# Patient Record
Sex: Female | Born: 2000 | Race: Black or African American | Hispanic: No | Marital: Single | Smoking: Never smoker
Health system: Southern US, Community
[De-identification: ages and names within clinical notes are randomized; demographics above are authoritative.]

## PROBLEM LIST (undated history)

## (undated) DIAGNOSIS — J4520 Mild intermittent asthma, uncomplicated: Secondary | ICD-10-CM

## (undated) DIAGNOSIS — L309 Dermatitis, unspecified: Secondary | ICD-10-CM

## (undated) DIAGNOSIS — J45909 Unspecified asthma, uncomplicated: Secondary | ICD-10-CM

## (undated) HISTORY — DX: Mild intermittent asthma, uncomplicated: J45.20

## (undated) HISTORY — DX: Dermatitis, unspecified: L30.9

---
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
## 2020-04-30 IMAGING — DX PORTABLE CHEST - 1 VIEW
1 series · 1 of 1 positions shown · non-contrast
Comparison: None.

CLINICAL DATA: Upper abdominal pain

EXAM:
PORTABLE CHEST 1 VIEW

[chest ap]
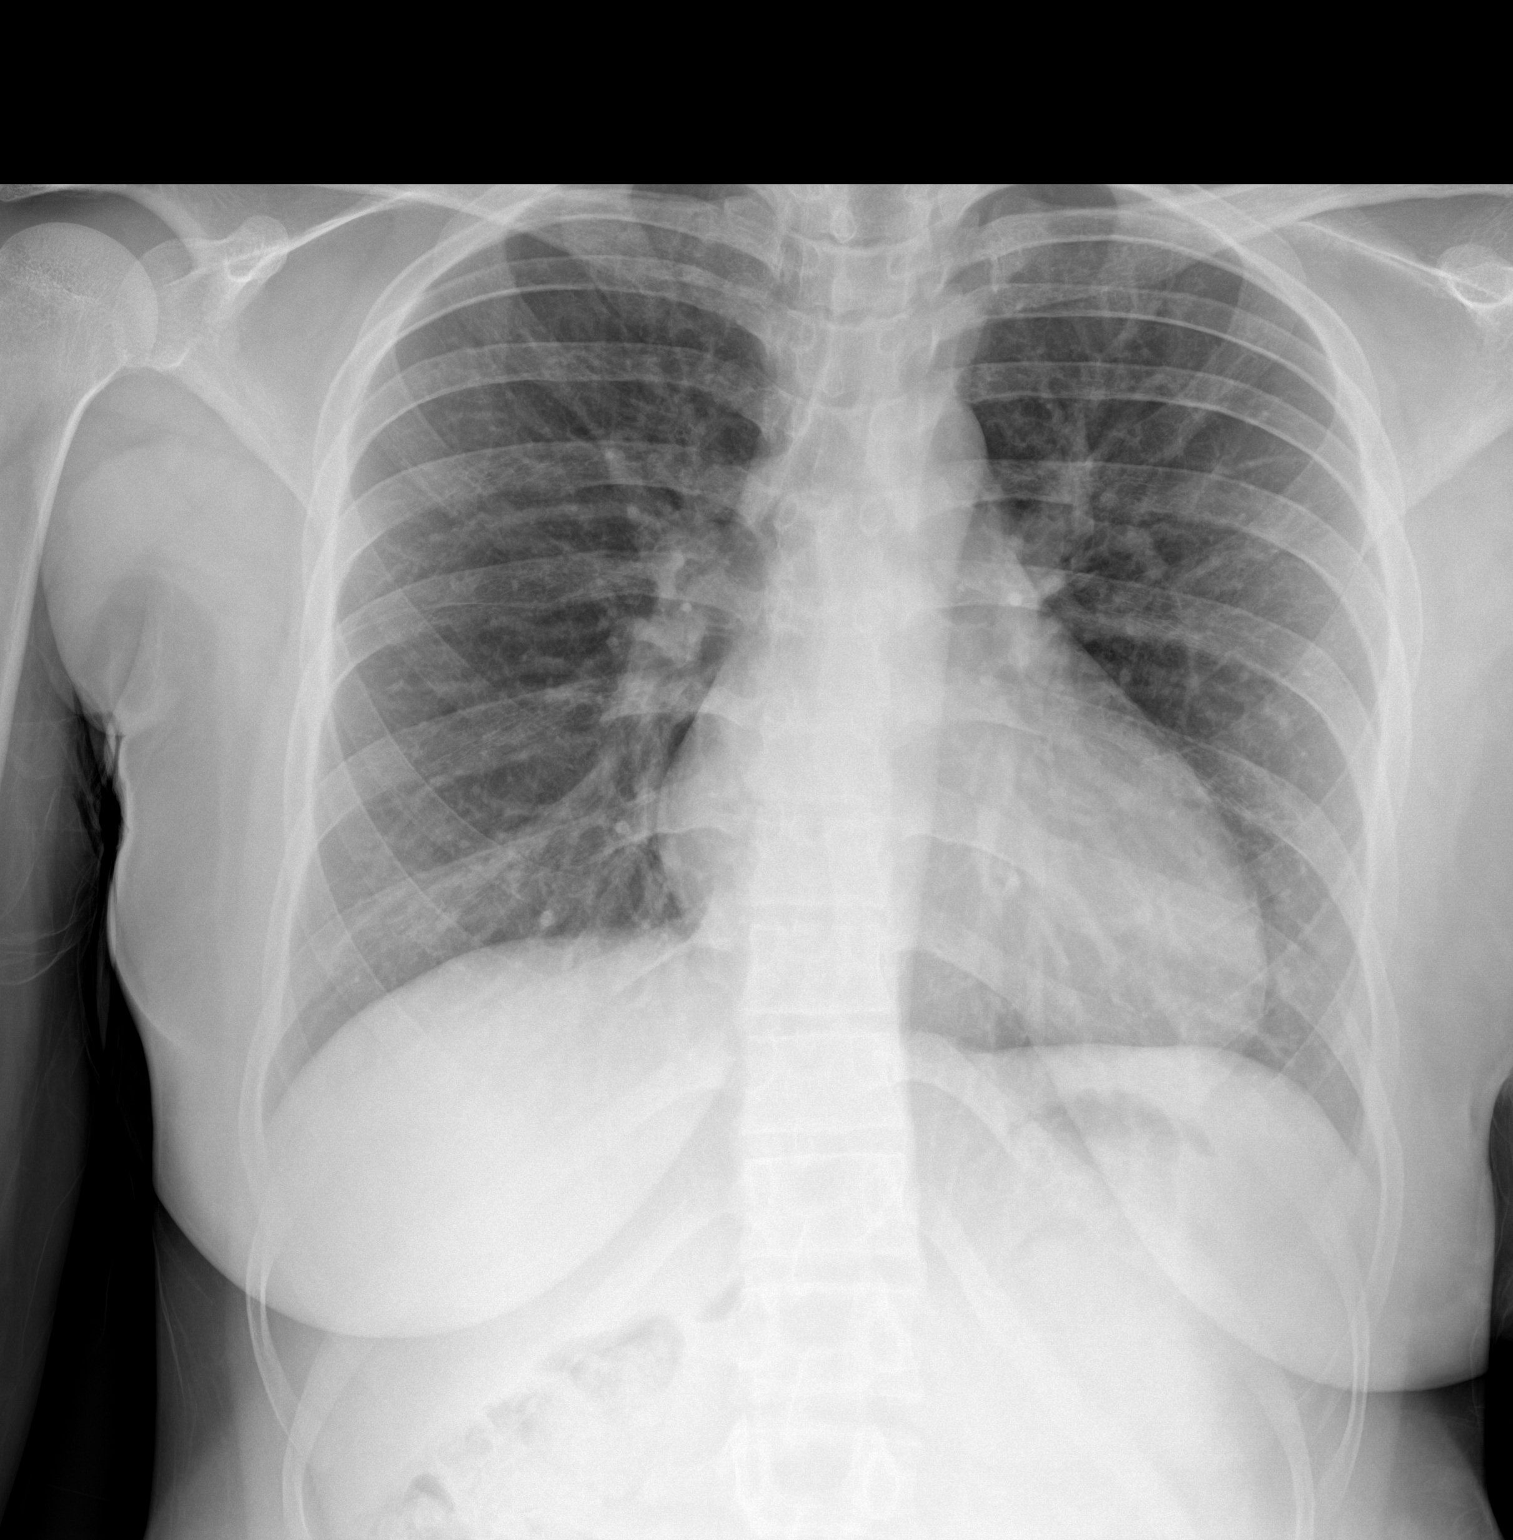

[1 of 1 positions shown; findings below may reference images not displayed]

FINDINGS: Borderline heart size accentuated by portable technique. Normal
mediastinal contours. There is no edema, consolidation, effusion, or
pneumothorax.
IMPRESSION: No active disease.

## 2020-05-30 IMAGING — US ULTRASOUND ABDOMEN LIMITED
1 series · 14 of 25 positions shown · non-contrast
Comparison: No prior.

CLINICAL DATA: Upper abdominal pain.

EXAM:
ULTRASOUND ABDOMEN LIMITED RIGHT UPPER QUADRANT

[Series 1: ultrasound abdomen limited · 0.22mm/px · 14 of 41 slices shown]
[im 1/41]
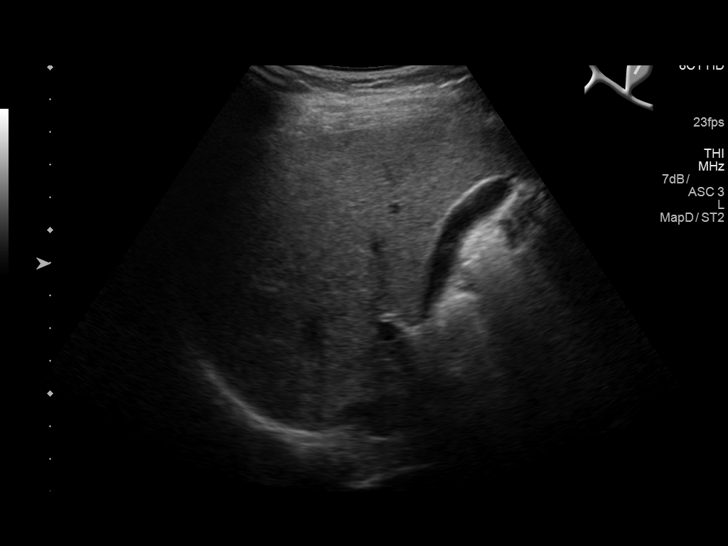
[im 4/41]
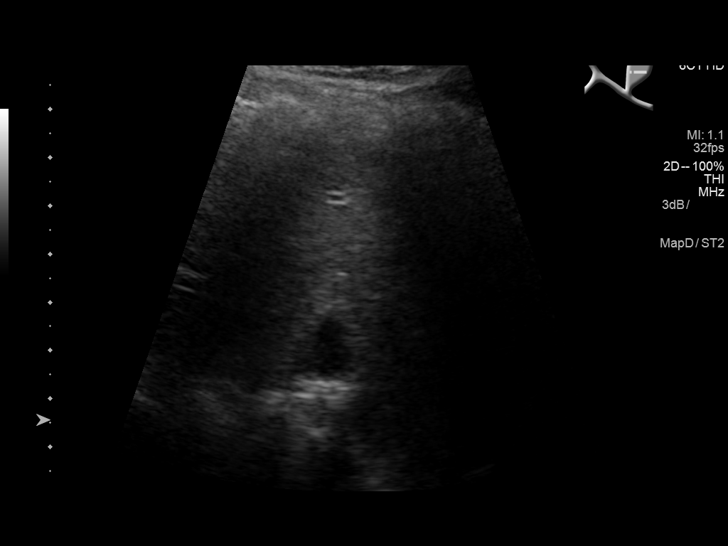
[im 7/41]
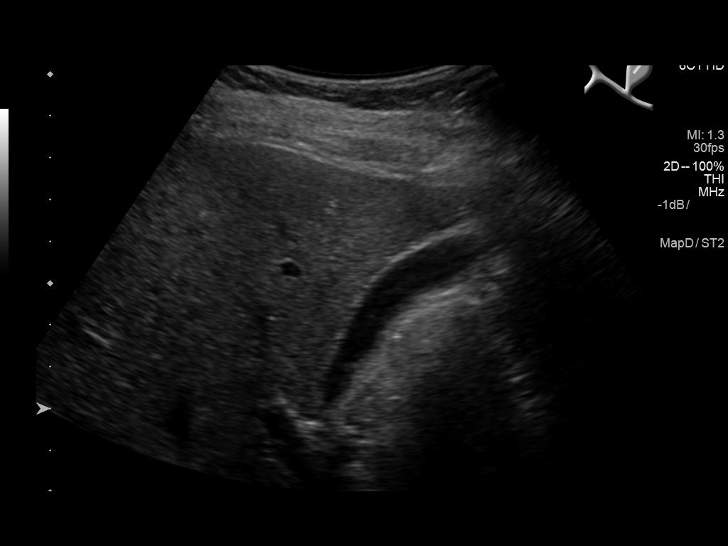
[im 11/41]
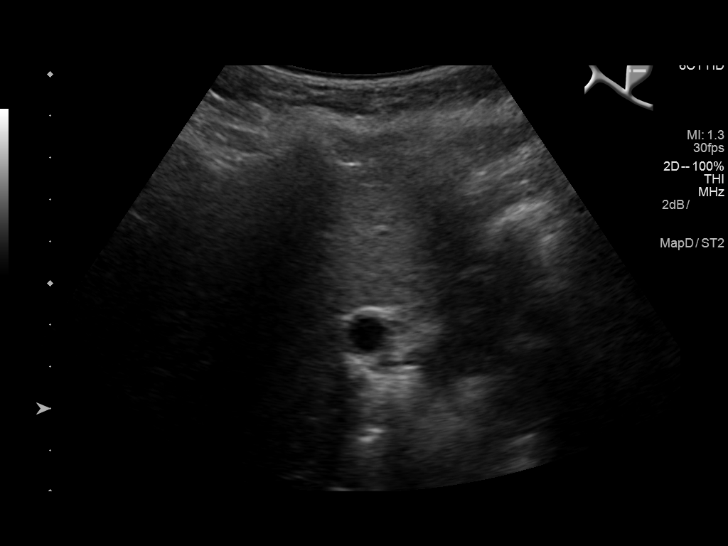
[im 14/41]
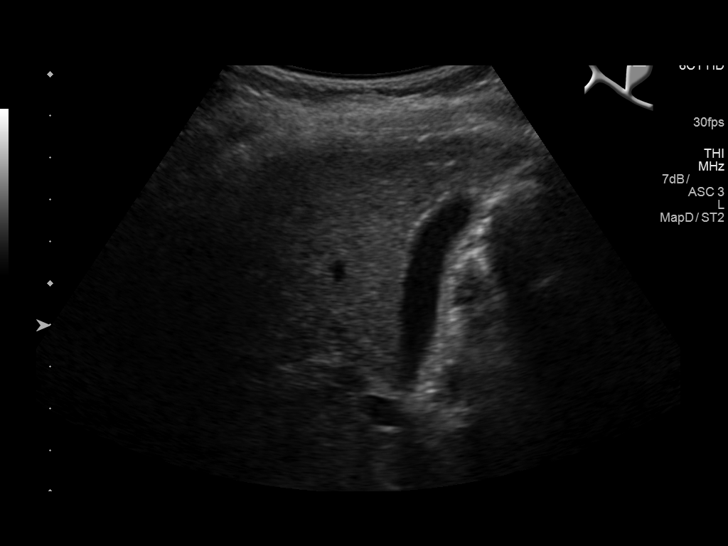
[im 16/41]
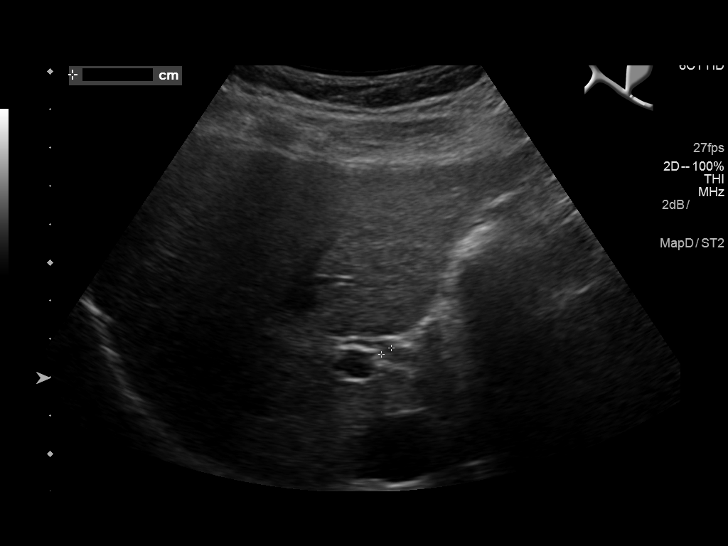
[im 19/41]
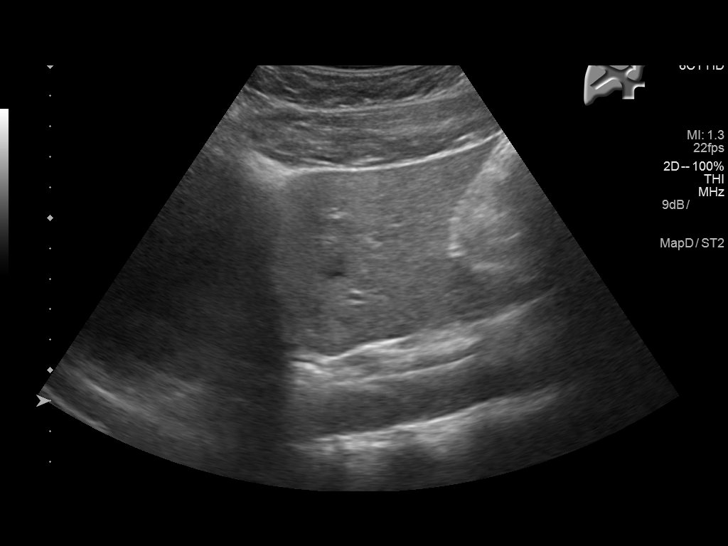
[im 22/41]
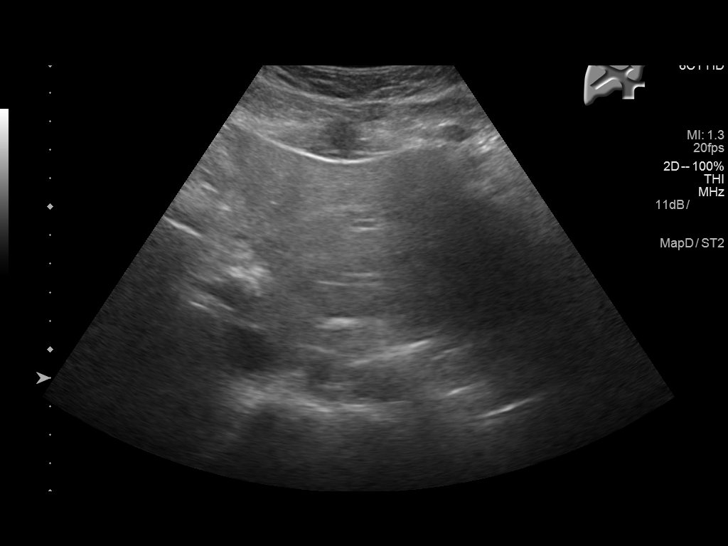
[im 26/41]
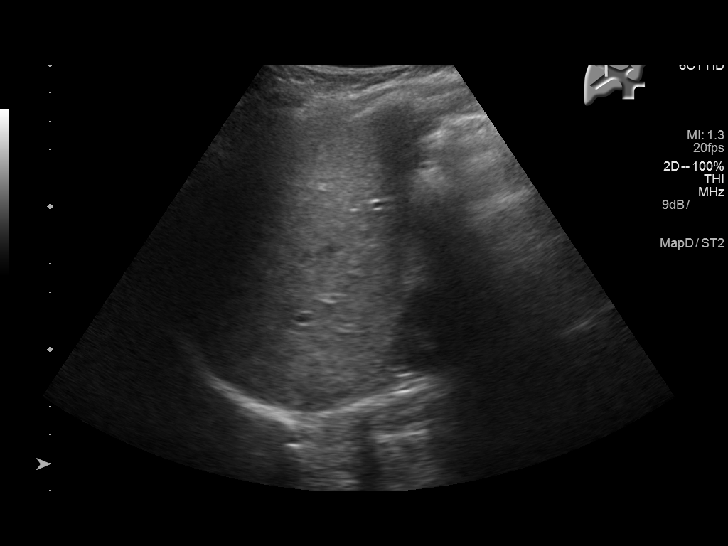
[im 27/41]
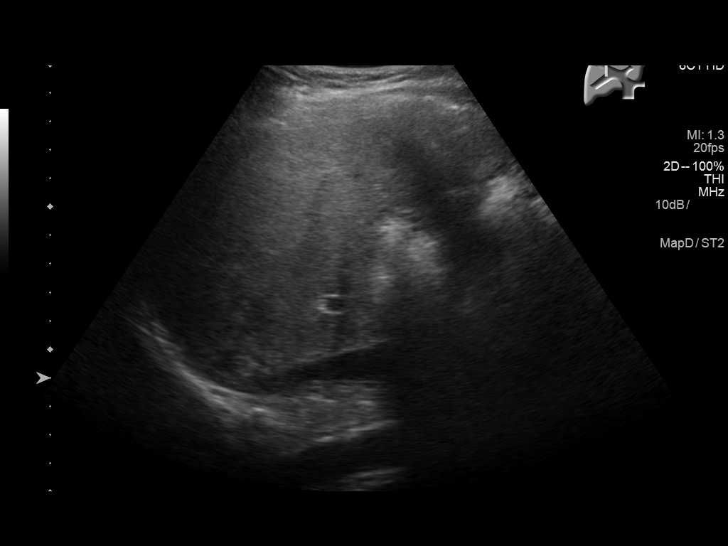
[im 31/41]
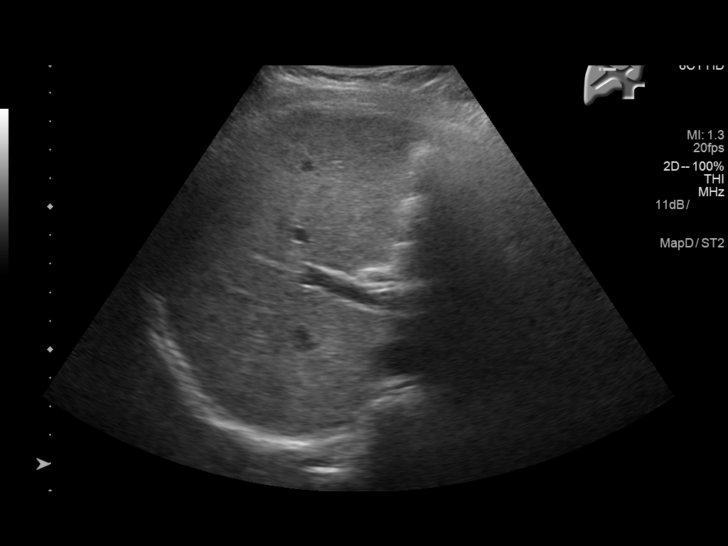
[im 34/41]
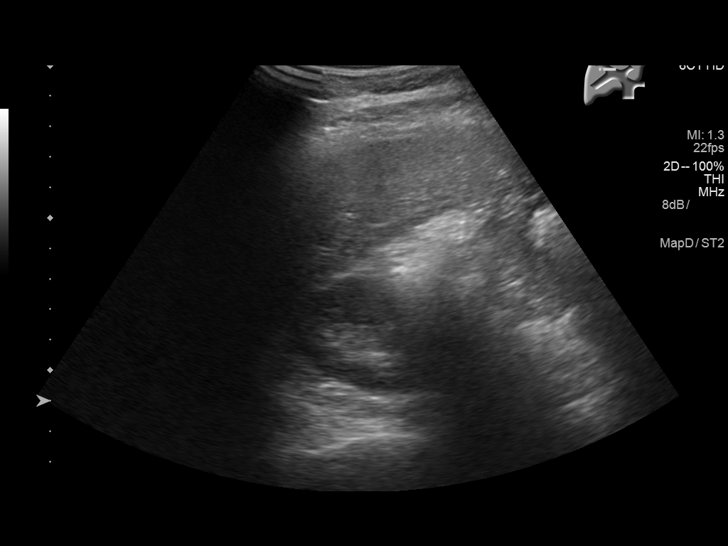
[im 37/41]
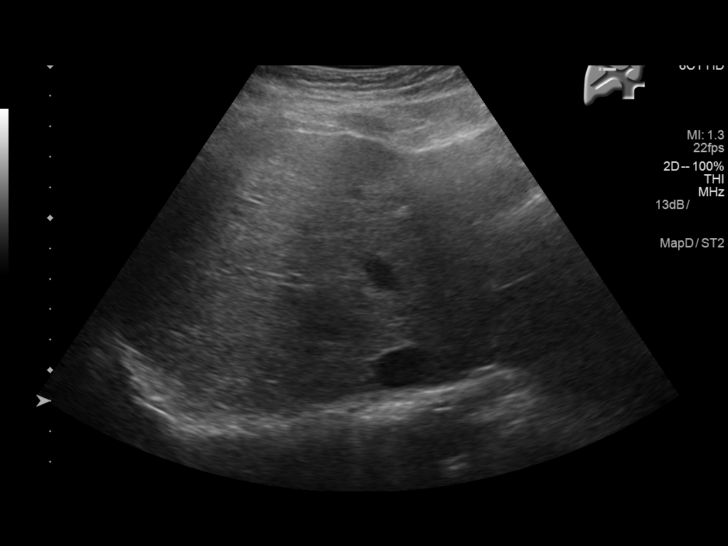
[im 41/41]
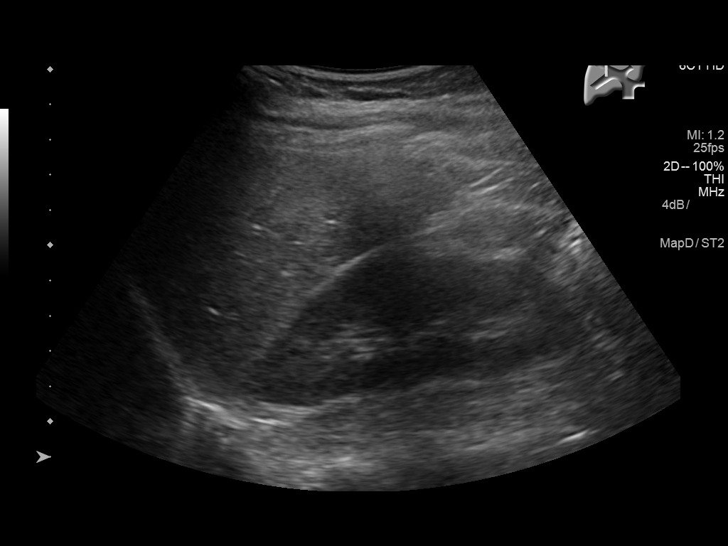

[14 of 25 positions shown; findings below may reference images not displayed]

FINDINGS: Gallbladder:

Partially contracted gallbladder. No gallstones identified.
Gallbladder wall thickness 2.3 mm. Negative Murphy sign.

Common bile duct:

Diameter: 3.1 mm

Liver:

No focal lesion identified. Within normal limits in parenchymal
echogenicity. Portal vein is patent on color Doppler imaging with
normal direction of blood flow towards the liver.
IMPRESSION: Partially contracted gallbladder. No gallstones identified. No
biliary distention. No focal abnormality identified.

## 2021-08-16 ENCOUNTER — Encounter: Payer: Self-pay | Admitting: *Deleted

## 2021-08-24 ENCOUNTER — Other Ambulatory Visit: Payer: Self-pay

## 2021-08-24 ENCOUNTER — Inpatient Hospital Stay: Payer: Medicaid Other

## 2021-08-24 ENCOUNTER — Encounter: Payer: Self-pay | Admitting: Internal Medicine

## 2021-08-24 ENCOUNTER — Inpatient Hospital Stay: Payer: Medicaid Other | Attending: Internal Medicine | Admitting: Internal Medicine

## 2021-08-24 DIAGNOSIS — D509 Iron deficiency anemia, unspecified: Secondary | ICD-10-CM | POA: Diagnosis present

## 2021-08-24 DIAGNOSIS — E611 Iron deficiency: Secondary | ICD-10-CM | POA: Insufficient documentation

## 2021-08-24 DIAGNOSIS — K5900 Constipation, unspecified: Secondary | ICD-10-CM | POA: Diagnosis not present

## 2021-08-24 NOTE — Assessment & Plan Note (Addendum)
#   Anemia-11 [SEP 2022; [facility]] iron deficiency- iron sat 9%; ferritin-NA.  Tolerating iron oral fairly well.  Mild constipation noted.  Discussed regarding iron bi- glycinate-if continues to have constipation.  #Given the mild iron deficiency/anemia recommend hold off IV iron infusions at this time.  #Etiology of iron deficiency: Likely heavy menstrual cycles.  Recommend follow-up with gynecologist regarding OCPs.   Thank you, Dr.Aleskerov for allowing me to participate in the care of your pleasant patient. Please do not hesitate to contact me with questions or concerns in the interim.  # DISPOSITION: # no labs today # follow up in 4 months- NP; labs- cbc/bmp; iron studies/ferritin-Dr.B  Dr.Johnston, [place]

## 2021-08-24 NOTE — Progress Notes (Signed)
[facility] CONSULT NOTE  Patient Care Team: Pa, [facility] as PCP - General  CHIEF COMPLAINTS/PURPOSE OF CONSULTATION: ANEMIA   HEMATOLOGY HISTORY:  # ANEMIA- hb 9-11 Iron sat 9% [sep 2022]; NO ferritin  EGD-; colonoscopy-NONE  # Hx of asthma [Dr.Aleskerov]  HISTORY OF PRESENTING ILLNESS:  Meghan Walls 20 y.o.  female has been referred to [place] for further evaluation/work-up for anemia.  Patient had been recently evaluated by pulmonary for her asthma.  Currently well controlled on antihistamines.  Patient complains of fatigue.  Complains of mild constipation on iron sulfate.  Blood in stools: None Change in bowel habits- None Blood in urine: None Difficulty swallowing: None Abnormal weight loss: None Iron supplementation: 3 weeks; consistently one pill a day.  Prior Blood transfusions: none Bariatric surgery: None EGD/Colonoscopy: none  Vaginal bleeding: heavy; not on OCPs.   Review of Systems  Constitutional:  Positive for malaise/fatigue. Negative for chills, diaphoresis, fever and weight loss.  HENT:  Negative for nosebleeds and sore throat.   Eyes:  Negative for double vision.  Respiratory:  Positive for shortness of breath. Negative for cough, hemoptysis, sputum production and wheezing.   Cardiovascular:  Negative for chest pain, palpitations, orthopnea and leg swelling.  Gastrointestinal:  Negative for abdominal pain, blood in stool, constipation, diarrhea, heartburn, melena, nausea and vomiting.  Genitourinary:  Negative for dysuria, frequency and urgency.  Musculoskeletal:  Negative for back pain and joint pain.  Skin: Negative.  Negative for itching and rash.  Neurological:  Negative for dizziness, tingling, focal weakness, weakness and headaches.  Endo/Heme/Allergies:  Does not bruise/bleed easily.  Psychiatric/Behavioral:  Negative for depression. The patient is not nervous/anxious and does not have insomnia.    MEDICAL  HISTORY:  Past Medical History:  Diagnosis Date   Asthma    Eczema, unspecified type    Intermittent asthma without complication     SURGICAL HISTORY: No past surgical history on file.  SOCIAL HISTORY: Social History   Socioeconomic History   Marital status: Single    Spouse name: Not on file   Number of children: Not on file   Years of education: Not on file   Highest education level: Not on file  Occupational History   Not on file  Tobacco Use   Smoking status: Never   Smokeless tobacco: Never  Substance and Sexual Activity   Alcohol use: Not Currently   Drug use: Never   Sexual activity: Not on file  Other Topics Concern   Not on file  Social History Narrative   [employer];  lives in [place]; no smoking; no alcohol.    Social Determinants of Health   Financial Resource Strain: Not on file  Food Insecurity: Not on file  Transportation Needs: Not on file  Physical Activity: Not on file  Stress: Not on file  Social Connections: Not on file  Intimate Partner Violence: Not on file    FAMILY HISTORY: Family History  Problem Relation Age of Onset   Anemia Mother     ALLERGIES:  has No Known Allergies.  MEDICATIONS:  Current Outpatient Medications  Medication Sig Dispense Refill   albuterol (PROVENTIL) (2.5 MG/3ML) 0.083% nebulizer solution USE 1 VIAL VIA NEBULIZER EVERY 4 HOURS AS NEEDED FOR WHEEZE     albuterol (VENTOLIN HFA) 108 (90 Base) MCG/ACT inhaler INHALE 2 PUFFS Q4 HOURS AS NEEDED FOR COUGH OR WHEEZE     ferrous sulfate 325 (65 FE) MG tablet TAKE 1 TAB BY MOUTH TWICE A  DAY FOR 60 DAYS     loratadine (CLARITIN) 10 MG tablet Take 10 mg by mouth daily.     montelukast (SINGULAIR) 10 MG tablet Take 10 mg by mouth at bedtime.     predniSONE (DELTASONE) 10 MG tablet Take 4 tabs (40 mg) PO x 3 days, then take 2 tabs (20 mg) PO x 3 days, then take 1 tab (10 mg) PO x 3 days, then take 1/2 tab (5 mg) PO x 4 days. (Patient not taking: Reported on  04/14/2019) 23 tablet 0   propranolol (INDERAL) 20 MG tablet Take 1 tablet (20 mg total) by mouth 2 (two) times daily. (Start with half a tablet twice daily for 1 week) (Patient not taking: Reported on 08/24/2021) 60 tablet 2   No current facility-administered medications for this visit.      PHYSICAL EXAMINATION:   Vitals:   08/24/21 1423  BP: 131/85  Pulse: 82  Resp: 18  Temp: 98.9 F (37.2 C)  SpO2: 100%   Filed Weights   08/24/21 1423  Weight: 159 lb (72.1 kg)    Physical Exam Vitals and nursing note reviewed.  HENT:     Head: Normocephalic and atraumatic.     Mouth/Throat:     Pharynx: Oropharynx is clear.  Eyes:     Extraocular Movements: Extraocular movements intact.     Pupils: Pupils are equal, round, and reactive to light.  Cardiovascular:     Rate and Rhythm: Normal rate and regular rhythm.  Pulmonary:     Comments: Decreased breath sounds bilaterally.  Abdominal:     Palpations: Abdomen is soft.  Musculoskeletal:        General: Normal range of motion.     Cervical back: Normal range of motion.  Skin:    General: Skin is warm.  Neurological:     General: No focal deficit present.     Mental Status: She is alert and oriented to person, place, and time.  Psychiatric:        Behavior: Behavior normal.        Judgment: Judgment normal.    LABORATORY DATA:  I have reviewed the data as listed Lab Results  Component Value Date   WBC 10.3 03/05/2019   HGB 9.8 (L) 03/05/2019   HCT 33.6 (L) 03/05/2019   MCV 70.4 (L) 03/05/2019   PLT 372 03/05/2019   No results for input(s): NA, K, CL, CO2, GLUCOSE, BUN, CREATININE, CALCIUM, GFRNONAA, GFRAA, PROT, ALBUMIN, AST, ALT, ALKPHOS, BILITOT, BILIDIR, IBILI in the last 8760 hours.   No results found.  Iron deficiency # Anemia-11 [SEP 2022; [facility]] iron deficiency- iron sat 9%; ferritin-NA.  Tolerating iron oral fairly well.  Mild constipation noted.  Discussed regarding iron bi- glycinate-if continues to  have constipation.  #Given the mild iron deficiency/anemia recommend hold off IV iron infusions at this time.  #Etiology of iron deficiency: Likely heavy menstrual cycles.  Recommend follow-up with gynecologist regarding OCPs.   Thank you, Dr.Aleskerov for allowing me to participate in the care of your pleasant patient. Please do not hesitate to contact me with questions or concerns in the interim.  # DISPOSITION: # no labs today # follow up in 4 months- NP; labs- cbc/bmp; iron studies/ferritin-Dr.B  Dr.Johnston, [place]     All questions were answered. The patient knows to call the clinic with any problems, questions or concerns.      Earna Coder, MD 08/24/2021 2:57 PM

## 2021-08-24 NOTE — Progress Notes (Signed)
Pt referred by PCP for anemia. She thought that she was having problems related to her asthma, but discovered that she was anemic.She reports episodic dizziness and shortness of breath, but none at this time.

## 2021-10-25 ENCOUNTER — Emergency Department: Payer: Medicaid Other

## 2021-10-25 ENCOUNTER — Emergency Department
Admission: EM | Admit: 2021-10-25 | Discharge: 2021-10-26 | Disposition: A | Discharge status: Home / Self Care | Payer: Medicaid Other | Attending: Emergency Medicine | Admitting: Emergency Medicine

## 2021-10-25 ENCOUNTER — Other Ambulatory Visit: Payer: Self-pay

## 2021-10-25 DIAGNOSIS — R519 Headache, unspecified: Secondary | ICD-10-CM | POA: Insufficient documentation

## 2021-10-25 DIAGNOSIS — J4520 Mild intermittent asthma, uncomplicated: Secondary | ICD-10-CM | POA: Diagnosis not present

## 2021-10-25 DIAGNOSIS — R531 Weakness: Secondary | ICD-10-CM | POA: Diagnosis not present

## 2021-10-25 DIAGNOSIS — G629 Polyneuropathy, unspecified: Secondary | ICD-10-CM | POA: Diagnosis not present

## 2021-10-25 DIAGNOSIS — R079 Chest pain, unspecified: Secondary | ICD-10-CM | POA: Insufficient documentation

## 2021-10-25 DIAGNOSIS — R200 Anesthesia of skin: Secondary | ICD-10-CM | POA: Diagnosis present

## 2021-10-25 LAB — BASIC METABOLIC PANEL
Anion gap: 7 (ref 5–15)
BUN: 12 mg/dL (ref 6–20)
CO2: 25 mmol/L (ref 22–32)
Calcium: 9.7 mg/dL (ref 8.9–10.3)
Chloride: 104 mmol/L (ref 98–111)
Creatinine, Ser: 0.67 mg/dL (ref 0.44–1.00)
GFR, Estimated: >60 mL/min (ref >60)
Glucose, Bld: 113 mg/dL — ABNORMAL HIGH (ref 70–99)
Potassium: 3.7 mmol/L (ref 3.5–5.1)
Sodium: 136 mmol/L (ref 135–145)

## 2021-10-25 LAB — CBC
HCT: 36.6 % (ref 36.0–46.0)
Hemoglobin: 11.1 g/dL — ABNORMAL LOW (ref 12.0–15.0)
MCH: 21.9 pg — ABNORMAL LOW (ref 26.0–34.0)
MCHC: 30.3 g/dL (ref 30.0–36.0)
MCV: 72.2 fL — ABNORMAL LOW (ref 80.0–100.0)
Platelets: 397 10*3/uL (ref 150–400)
RBC: 5.07 MIL/uL (ref 3.87–5.11)
RDW: 17.3 % — ABNORMAL HIGH (ref 11.5–15.5)
WBC: 9.7 10*3/uL (ref 4.0–10.5)
nRBC: 0 % (ref 0.0–0.2)

## 2021-10-25 NOTE — ED Triage Notes (Signed)
Pt to ED for tingling/numbness and pain to whole left side of body intermittent for last few weeks worsening within last few days. Also reports left sided headache intermittent.  Denies balance issues or difficulty walking. Denies recent falls  Verbal orders stafford MD

## 2021-10-26 NOTE — Discharge Instructions (Addendum)
As we discussed, your emergency department evaluation was reassuring today.  We discussed obtaining MRIs for further evaluation, but you would prefer to follow-up with neurology given the potential insurance difficulties with the MRIs.  This is a reasonable plan, but please call the neurology office in the morning to schedule the next available follow-up appointment.  Return to the emergency department if you develop new or worsening symptoms that concern you.

## 2021-10-26 NOTE — ED Provider Notes (Signed)
[facility] Provider Note    Event Date/Time   First MD Initiated Contact with Patient 10/25/21 2307     (approximate)   History   Numbness   HPI  Meghan Walls is a 21 y.o. female who is known medical history includes only mild intermittent asthma and eczema.  She presents for evaluation of about 2 months of intermittent but persistent paresthesias.  She describes a combination of numbness and tingling most notable in her left foot but also radiating throughout the left side of her leg and occasionally in her entire left side of her body.  She sometimes feels it in the right arm but not always.  She occasionally has a left-sided headache but it does not feel like a migraine.  The symptoms are not just numbness but also painful.  They have becoming more frequent and more severe.  She occasionally has chest pain and notes that all of the symptoms are worse at night than they seem to be during the day.  She is not primarily concerned about weakness but sometimes she feels like her left arm and hand are weaker than the right.  She saw her primary care doctor about 6 weeks ago.  He referred her to the office of Dr. Melrose Nakayama with neurology.  She was seen by neurology and had extensive lab work which was reassuring.  She was told that they may proceed with MRIs and possibly nerve conduction studies.  However the patient reports that her insurance would not approve an MRI so she has somewhat come to a halt in terms of the neurology work-up.  She is concerned because the symptoms seem to be becoming more frequent and more severe.  She denies visual changes, balance disturbances, difficulty walking, or difficulty with coordination.  She has had no word finding difficulties or difficulty with speech.  No facial droop.  No difficulty breathing, no recent fever, no neck pain or stiffness.  No recent trauma.   Physical Exam   Triage Vital Signs: ED Triage Vitals  Enc  Vitals Group     BP 10/25/21 1814 (!) 142/78     Pulse Rate 10/25/21 1814 (!) 105     Resp 10/25/21 1814 18     Temp 10/25/21 1814 99.6 F (37.6 C)     Temp Source 10/25/21 1814 Oral     SpO2 10/25/21 1814 100 %     Weight 10/25/21 1815 72.6 kg (160 lb)     Height 10/25/21 1815 1.6 m (5\' 3" )     Head Circumference --      Peak Flow --      Pain Score 10/25/21 1815 4     Pain Loc --      Pain Edu? --      Excl. in [place]? --     Most recent vital signs: Vitals:   10/25/21 1814 10/25/21 2318  BP: (!) 142/78 133/82  Pulse: (!) 105 90  Resp: 18 16  Temp: 99.6 F (37.6 C)   SpO2: 100% 100%     General: Awake, no distress.  Overall healthy and well appearance. CV:  Good peripheral perfusion.  No murmurs upon auscultation. Resp:  Normal effort.  Lungs are clear to auscultation bilaterally. Abd:  No distention.  No abdominal tenderness. Neuro:  Cranial nerves grossly intact.  No facial droop, no dysarthria, no dysphagia, no aphasia.  Good grip strength bilaterally, good major muscle groups strength in the upper and lower extremities, no  difference appreciated between the 2 sides.  Subjective tingling in the left arm and hand.   ED Results / Procedures / Treatments   Labs (all labs ordered are listed, but only abnormal results are displayed) Labs Reviewed  CBC - Abnormal; Notable for the following components:      Result Value   Hemoglobin 11.1 (*)    MCV 72.2 (*)    MCH 21.9 (*)    RDW 17.3 (*)    All other components within normal limits  BASIC METABOLIC PANEL - Abnormal; Notable for the following components:   Glucose, Bld 113 (*)    All other components within normal limits    RADIOLOGY Patient declined MRIs tonight.  I personally reviewed the patient's head CT and I appreciated no mass or intracranial bleed.  Radiologist also did not identify any acute abnormality or explanation for the patient's symptoms.    PROCEDURES:  Critical Care performed:  No  Procedures   MEDICATIONS ORDERED IN ED: Medications - No data to display   IMPRESSION / MDM / [place] / ED COURSE  I reviewed the triage vital signs and the nursing notes.                              Differential diagnosis includes, but is not limited to, nonspecific neuropathy, nerve root compression, MS, intracranial hemorrhage, intracranial neoplasm or other mass, less likely CVA.  Neurodegenerative diseases are also possible.  Vital signs are stable and within normal limits.  Patient's physical exam including neurological exam are reassuring with no gross deficiencies or discrepancies noted.  Symptoms have been present for about 2 months although she reports that these seem to be getting worse.  I have very low suspicion for pulmonary embolism, ACS, or CVA given that these are not consistent with her history.  She also does not appear to have an acute neurosurgical issue requiring emergent intervention such as spinal cord compression.  Head CT interpreted by me and the radiologist as normal.  Labs ordered in triage include basic metabolic panel and CBC.  I reviewed the results and her basic metabolic panel is within normal limits and her CBC is within normal limits other than very slightly decreased hemoglobin.  I reviewed the primary care doctor note by Dr. Edwina Barth from 09/06/2021 as well as the follow-up appointment with neurology in the office of Dr. Melrose Nakayama on 09/19/2021.  I verified that the initial plan was for MRIs and possible nerve conduction studies but that the lab work, much more extensive than obtainable in the emergency department, was reassuring.  The patient's father is at bedside and with her permission we discussed her situation and symptoms.  I explained that there is not any evidence of an emergent medical condition but given the symptoms, I think it is reasonable to obtain an MRI tonight.  Specifically my plan was to order an MRI brain and MRI cervical  spine given that her symptoms seem mostly confined to her head and her left arm even though sometimes they also affect her left leg.  I explained that my primary concern was the possibility of a new diagnosis of multiple sclerosis and that we could look for plaques that would not be visible on the CT scan.  However, the patient and her family are understandably concerned that insurance will not cover the MRIs.  I explained that I do not have control over that, but I would document  well why I wanted to obtain them and the reason it was important.  They discussed that but ultimately the patient would rather follow-up as an outpatient with neurology and try again to see if they can be preapproved.  I think this is reasonable and will likely not change the ultimate work-up or results.  I encouraged her to call neurology first thing in the morning to schedule the next available follow-up appointment.  I considered admission but again, as described above, there is no evidence of an emergent condition for which she would benefit coming into the hospital for urgent neurosurgical consultation, and she already has a relationship with neurology in [place] and should be able to follow-up expeditiously.  I gave my usual and customary follow-up recommendations and return precautions and she understands and agrees with the plan.           FINAL CLINICAL IMPRESSION(S) / ED DIAGNOSES   Final diagnoses:  Neuropathy     Rx / DC Orders   ED Discharge Orders     None        Note:  This document was prepared using Dragon voice recognition software and may include unintentional dictation errors.   Hinda Kehr, MD 10/26/21 [phone]

## 2021-11-02 ENCOUNTER — Other Ambulatory Visit (HOSPITAL_COMMUNITY): Payer: Self-pay | Admitting: Physician Assistant

## 2021-11-02 ENCOUNTER — Other Ambulatory Visit: Payer: Self-pay | Admitting: Physician Assistant

## 2021-11-02 DIAGNOSIS — R202 Paresthesia of skin: Secondary | ICD-10-CM

## 2021-11-02 DIAGNOSIS — R531 Weakness: Secondary | ICD-10-CM

## 2021-11-02 DIAGNOSIS — R42 Dizziness and giddiness: Secondary | ICD-10-CM

## 2021-11-02 DIAGNOSIS — R200 Anesthesia of skin: Secondary | ICD-10-CM

## 2021-11-02 DIAGNOSIS — R519 Headache, unspecified: Secondary | ICD-10-CM

## 2021-11-08 ENCOUNTER — Other Ambulatory Visit: Payer: Self-pay

## 2021-11-08 ENCOUNTER — Ambulatory Visit
Admission: RE | Admit: 2021-11-08 | Discharge: 2021-11-08 | Disposition: A | Discharge status: Home / Self Care | Payer: Medicaid Other | Source: Ambulatory Visit | Attending: Physician Assistant | Admitting: Physician Assistant

## 2021-11-08 DIAGNOSIS — R42 Dizziness and giddiness: Secondary | ICD-10-CM | POA: Diagnosis present

## 2021-11-08 DIAGNOSIS — R531 Weakness: Secondary | ICD-10-CM | POA: Insufficient documentation

## 2021-11-08 DIAGNOSIS — R200 Anesthesia of skin: Secondary | ICD-10-CM | POA: Diagnosis present

## 2021-11-08 DIAGNOSIS — R519 Headache, unspecified: Secondary | ICD-10-CM | POA: Insufficient documentation

## 2021-11-08 DIAGNOSIS — R202 Paresthesia of skin: Secondary | ICD-10-CM | POA: Diagnosis present

## 2021-12-11 ENCOUNTER — Other Ambulatory Visit: Payer: Self-pay | Admitting: *Deleted

## 2021-12-11 DIAGNOSIS — E611 Iron deficiency: Secondary | ICD-10-CM

## 2021-12-21 ENCOUNTER — Inpatient Hospital Stay: Payer: Medicaid Other | Attending: Internal Medicine

## 2021-12-21 ENCOUNTER — Inpatient Hospital Stay: Payer: Medicaid Other | Admitting: Internal Medicine

## 2022-07-25 NOTE — Telephone Encounter (Signed)
error
